# Patient Record
Sex: Male | Born: 1970 | Race: White | Hispanic: No | Marital: Single | Smoking: Current every day smoker
Health system: Southern US, Community
[De-identification: ages and names within clinical notes are randomized; demographics above are authoritative.]

---

## 2010-08-20 ENCOUNTER — Emergency Department (INDEPENDENT_AMBULATORY_CARE_PROVIDER_SITE_OTHER): Payer: No Typology Code available for payment source

## 2010-08-20 ENCOUNTER — Emergency Department (HOSPITAL_BASED_OUTPATIENT_CLINIC_OR_DEPARTMENT_OTHER)
Admission: EM | Admit: 2010-08-20 | Discharge: 2010-08-20 | Disposition: A | Discharge status: Home / Self Care | Payer: No Typology Code available for payment source | Attending: Emergency Medicine | Admitting: Emergency Medicine

## 2010-08-20 DIAGNOSIS — M549 Dorsalgia, unspecified: Secondary | ICD-10-CM | POA: Insufficient documentation

## 2010-08-20 DIAGNOSIS — F17200 Nicotine dependence, unspecified, uncomplicated: Secondary | ICD-10-CM | POA: Insufficient documentation

## 2010-08-20 DIAGNOSIS — Y92410 Unspecified street and highway as the place of occurrence of the external cause: Secondary | ICD-10-CM | POA: Insufficient documentation
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
## 2013-08-19 ENCOUNTER — Emergency Department (HOSPITAL_BASED_OUTPATIENT_CLINIC_OR_DEPARTMENT_OTHER)
Admission: EM | Admit: 2013-08-19 | Discharge: 2013-08-19 | Discharge status: Against Medical Advice | Payer: No Typology Code available for payment source | Attending: Emergency Medicine | Admitting: Emergency Medicine

## 2013-08-19 ENCOUNTER — Encounter (HOSPITAL_BASED_OUTPATIENT_CLINIC_OR_DEPARTMENT_OTHER): Payer: Self-pay | Admitting: Emergency Medicine

## 2013-08-19 DIAGNOSIS — R059 Cough, unspecified: Secondary | ICD-10-CM | POA: Insufficient documentation

## 2013-08-19 DIAGNOSIS — F17200 Nicotine dependence, unspecified, uncomplicated: Secondary | ICD-10-CM | POA: Insufficient documentation

## 2013-08-19 DIAGNOSIS — R05 Cough: Secondary | ICD-10-CM | POA: Insufficient documentation

## 2013-08-19 NOTE — ED Notes (Signed)
Cough for 11 days. Not getting better with time. States his lips have been purple for the past day.
